# Patient Record
Sex: Male | Born: 1990 | Race: White | Hispanic: No | Smoking: Never smoker
Health system: Southern US, Community
[De-identification: ages and names within clinical notes are randomized; demographics above are authoritative.]

## PROBLEM LIST (undated history)

## (undated) DIAGNOSIS — F329 Major depressive disorder, single episode, unspecified: Secondary | ICD-10-CM

## (undated) DIAGNOSIS — F32A Depression, unspecified: Secondary | ICD-10-CM

## (undated) DIAGNOSIS — F988 Other specified behavioral and emotional disorders with onset usually occurring in childhood and adolescence: Secondary | ICD-10-CM

## (undated) DIAGNOSIS — F419 Anxiety disorder, unspecified: Secondary | ICD-10-CM

## (undated) DIAGNOSIS — E039 Hypothyroidism, unspecified: Secondary | ICD-10-CM

## (undated) HISTORY — DX: Depression, unspecified: F32.A

## (undated) HISTORY — PX: WISDOM TOOTH EXTRACTION: SHX21

## (undated) HISTORY — DX: Hypothyroidism, unspecified: E03.9

## (undated) HISTORY — PX: TYMPANOSTOMY TUBE PLACEMENT: SHX32

## (undated) HISTORY — DX: Other specified behavioral and emotional disorders with onset usually occurring in childhood and adolescence: F98.8

## (undated) HISTORY — DX: Major depressive disorder, single episode, unspecified: F32.9

## (undated) HISTORY — DX: Anxiety disorder, unspecified: F41.9

---
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
## 2011-08-28 ENCOUNTER — Ambulatory Visit (INDEPENDENT_AMBULATORY_CARE_PROVIDER_SITE_OTHER): Payer: 59 | Admitting: Internal Medicine

## 2011-08-28 VITALS — BP 110/72 | HR 77 | Temp 97.4°F | Resp 16 | Ht 70.0 in | Wt 178.0 lb

## 2011-08-28 DIAGNOSIS — F988 Other specified behavioral and emotional disorders with onset usually occurring in childhood and adolescence: Secondary | ICD-10-CM

## 2011-08-28 MED ORDER — DEXTROAMPHETAMINE SULFATE ER 10 MG PO CP24: 20.0000 mg | ORAL_CAPSULE | Freq: Every day | ORAL | Status: DC

## 2011-08-28 NOTE — Progress Notes (Signed)
  Subjective:    Patient ID: Brad Buck, male    DOB: 06/13/1990, 21 y.o.   MRN: [id number]  HPIF/U ADD On meds since Elem School/Dr Brassfield GDS-Scholarship to [employer]-econ/intern with [employer] this summer/would like to work at [employer] to [employer] for fall semester On Dex 10 SR 2 qd for many years and doing well without side effects    Review of Systems Noncontributory    Objective:   Physical Exam Vital signs normal Neurological grossly intact       Assessment & Plan:  P#1 ADD Meds ordered this encounter  Medications  . DISCONTD: dextroamphetamine (DEXTROSTAT) 10 MG tablet    Sig: Take 10 mg by mouth daily.  Marland Kitchen dextroamphetamine (DEXEDRINE) 10 MG 24 hr capsule    90 day for mail order   ( Sig: Take 2 capsules (20 mg total) by mouth daily.    Dispense:  180 capsule    Refill:  0  . dextroamphetamine (DEXEDRINE) 10 MG 24 hr capsule        30 d for now    Sig: Take 2 capsules (20 mg total) by mouth daily.    Dispense:  60 capsule    Refill:  0   Followup December 2013

## 2012-02-08 ENCOUNTER — Telehealth: Payer: Self-pay

## 2012-02-08 DIAGNOSIS — F988 Other specified behavioral and emotional disorders with onset usually occurring in childhood and adolescence: Secondary | ICD-10-CM

## 2012-02-08 NOTE — Telephone Encounter (Signed)
Pended Rx's please advise.

## 2012-02-08 NOTE — Telephone Encounter (Signed)
Dad is requesting 90 days of refills dextroamphetamine (DEXEDRINE) 10 MG 24 hr capsule He wants one to fill here and two to send off to the mail order pharmacy    CBN:  [phone]

## 2012-02-09 MED ORDER — DEXTROAMPHETAMINE SULFATE ER 10 MG PO CP24: 20.0000 mg | ORAL_CAPSULE | Freq: Every day | ORAL | Status: DC

## 2012-02-09 NOTE — Telephone Encounter (Signed)
Missed f/u in dec'13 Since back in sch at [employer] will refill but needs f/u before next ref Meds ordered this encounter  Medications  . dextroamphetamine (DEXEDRINE) 10 MG 24 hr capsule    Sig: Take 2 capsules (20 mg total) by mouth daily.    Dispense:  60 capsule    Refill:  0  . dextroamphetamine (DEXEDRINE) 10 MG 24 hr capsule    Sig: Take 2 capsules (20 mg total) by mouth daily. To fill on or after 03/09/12    Dispense:  60 capsule    Refill:  0  . dextroamphetamine (DEXEDRINE) 10 MG 24 hr capsule    Sig: Take 2 capsules (20 mg total) by mouth daily. To fill on 04/07/12 due for follow up before this runs out.    Dispense:  60 capsule    Refill:  0

## 2012-02-09 NOTE — Telephone Encounter (Signed)
Advised that rx's are ready for pickup and that needs follow up before running out

## 2012-06-26 ENCOUNTER — Ambulatory Visit (INDEPENDENT_AMBULATORY_CARE_PROVIDER_SITE_OTHER): Payer: 59 | Admitting: Internal Medicine

## 2012-06-26 VITALS — BP 120/76 | HR 88 | Temp 98.2°F | Resp 18 | Ht 70.0 in | Wt 187.0 lb

## 2012-06-26 DIAGNOSIS — R0989 Other specified symptoms and signs involving the circulatory and respiratory systems: Secondary | ICD-10-CM

## 2012-06-26 DIAGNOSIS — J157 Pneumonia due to Mycoplasma pneumoniae: Secondary | ICD-10-CM

## 2012-06-26 DIAGNOSIS — F988 Other specified behavioral and emotional disorders with onset usually occurring in childhood and adolescence: Secondary | ICD-10-CM

## 2012-06-26 DIAGNOSIS — R05 Cough: Secondary | ICD-10-CM

## 2012-06-26 MED ORDER — HYDROCODONE-HOMATROPINE 5-1.5 MG/5ML PO SYRP: 5.0000 mL | ORAL_SOLUTION | Freq: Four times a day (QID) | ORAL | Status: DC | PRN

## 2012-06-26 MED ORDER — AZITHROMYCIN 250 MG PO TABS: ORAL_TABLET | ORAL | Status: DC

## 2012-06-26 MED ORDER — DEXTROAMPHETAMINE SULFATE ER 10 MG PO CP24: 20.0000 mg | ORAL_CAPSULE | Freq: Every day | ORAL | Status: DC

## 2012-06-26 MED ORDER — PREDNISONE 20 MG PO TABS: ORAL_TABLET | ORAL | Status: DC

## 2012-06-26 NOTE — Progress Notes (Signed)
  Subjective:    Patient ID: Brad Buck, male    DOB: Mar 01, 1990, 22 y.o.   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 Good semester abroad in [place] to go to graduate school there Medications worked well for ADD To work this summer for [employer]   Review of Systems Noncontributory    Objective:   Physical Exam BP 120/76  Pulse 88  Temp(Src) 98.2 F (36.8 C) (Oral)  Resp 18  Ht 5\' 10"  (1.778 m)  Wt 187 lb (84.823 kg)  BMI 26.83 kg/m2  SpO2 98% TMs clear Nares clear Throat slight injection/no nodes Heart regular without murmur Lungs with scattered rhonchi and very slight wheeze with forced expiration Neuro intact      Assessment & Plan:  ADD (attention deficit disorder) Mycoplasma  Meds ordered this encounter  Medications  . azithromycin (ZITHROMAX) 250 MG tablet    Sig: As directed    Dispense:  6 tablet    Refill:  0  . HYDROcodone-homatropine (HYCODAN) 5-1.5 MG/5ML syrup    Sig: Take 5 mLs by mouth every 6 (six) hours as needed for cough.    Dispense:  120 mL    Refill:  0  . predniSONE (DELTASONE) 20 MG tablet    Sig: 3/2/1 single daily dose for 6 days    Dispense:  6 tablet    Refill:  0  . dextroamphetamine (DEXEDRINE) 10 MG 24 hr capsule    Sig: Take 2 capsules (20 mg total) by mouth daily. To fill on or after 08/26/12    Dispense:  60 capsule    Refill:  0  . dextroamphetamine (DEXEDRINE) 10 MG 24 hr capsule    Sig: Take 2 capsules (20 mg total) by mouth daily.    Dispense:  60 capsule    Refill:  0  . dextroamphetamine (DEXEDRINE) 10 MG 24 hr capsule    Sig: Take 2 capsules (20 mg total) by mouth daily. To fill on or after 07/26/12    Dispense:  60 capsule    Refill:  0   May call for Dexedrine for 3 more months / followup in 6 months

## 2013-01-15 ENCOUNTER — Ambulatory Visit (INDEPENDENT_AMBULATORY_CARE_PROVIDER_SITE_OTHER): Payer: 59 | Admitting: Internal Medicine

## 2013-01-15 ENCOUNTER — Encounter: Payer: Self-pay | Admitting: Internal Medicine

## 2013-01-15 VITALS — BP 114/78 | HR 88 | Temp 98.9°F | Resp 16 | Ht 68.0 in | Wt 213.2 lb

## 2013-01-15 DIAGNOSIS — F988 Other specified behavioral and emotional disorders with onset usually occurring in childhood and adolescence: Secondary | ICD-10-CM

## 2013-01-15 MED ORDER — DEXTROAMPHETAMINE SULFATE ER 10 MG PO CP24: 20.0000 mg | ORAL_CAPSULE | Freq: Every day | ORAL | Status: DC

## 2013-01-15 NOTE — Progress Notes (Signed)
   Subjective:    Patient ID: Brad Buck, male    DOB: Nov 23, 1990, 22 y.o.   MRN: [id number]  HPI followup Attention deficit disorder Meds her accomplishing purpose without side effects  Senior--[employer]--doing very well ?[employer] grad sch applied econ masters 1 year Review of Systems Noncontributory    Objective:   Physical Exam  Nursing note and vitals reviewed. Constitutional: He is oriented to person, place, and time. He appears well-developed and well-nourished. No distress.  HENT:  Head: Normocephalic and atraumatic.  Eyes: Pupils are equal, round, and reactive to light.  Neck: Normal range of motion.  Cardiovascular: Normal rate and regular rhythm.   Pulmonary/Chest: Effort normal. No respiratory distress.  Musculoskeletal: Normal range of motion.  Neurological: He is alert and oriented to person, place, and time.  Skin: Skin is warm and dry.  Psychiatric: He has a normal mood and affect. His behavior is normal.   BP 114/78  Pulse 88  Temp(Src) 98.9 F (37.2 C) (Oral)  Resp 16  Ht 5\' 8"  (1.727 m)  Wt 213 lb 3.2 oz (96.707 kg)  BMI 32.42 kg/m2  SpO2 98%        Assessment & Plan:  ADD (attention deficit disorder) - Plan: dextroamphetamine (DEXEDRINE) 10 MG 24 hr capsule to be continued May call 3 mos  Meds ordered this encounter  Medications  . dextroamphetamine (DEXEDRINE) 10 MG 24 hr capsule    Sig: Take 2 capsules (20 mg total) by mouth daily.    Dispense:  60 capsule    Refill:  0  . dextroamphetamine (DEXEDRINE) 10 MG 24 hr capsule    Sig: Take 2 capsules (20 mg total) by mouth daily.    Dispense:  180 capsule    Refill:  0

## 2013-01-25 HISTORY — PX: OTHER SURGICAL HISTORY: SHX169

## 2013-08-20 ENCOUNTER — Ambulatory Visit (INDEPENDENT_AMBULATORY_CARE_PROVIDER_SITE_OTHER): Payer: 59 | Admitting: Internal Medicine

## 2013-08-20 VITALS — BP 120/86 | HR 80 | Temp 98.8°F | Resp 16 | Ht 69.75 in | Wt 214.2 lb

## 2013-08-20 DIAGNOSIS — F988 Other specified behavioral and emotional disorders with onset usually occurring in childhood and adolescence: Secondary | ICD-10-CM

## 2013-08-20 MED ORDER — DEXTROAMPHETAMINE SULFATE ER 10 MG PO CP24: 20.0000 mg | ORAL_CAPSULE | Freq: Every day | ORAL | Status: DC

## 2013-08-20 NOTE — Progress Notes (Signed)
   Subjective:  This chart was scribed for Brad Pearsonobert P Nataliah Hatlestad, MD by Charline BillsEssence Howell, ED Scribe. The patient was seen in room 14. Patient's care was started at 5:30 PM.   Patient ID: Brad RankinsWilliam A Buck, male    DOB: 22-Oct-1990, 23 y.o.   MRN: [id number]  Chief Complaint  Patient presents with  . Medication Refill    dexedrine spansule 10 mg  . Immunizations    Wants dr to look over immunization records for school   HPI HPI Comments: Brad Buck is a 23 y.o. male, with a h/o ADD, who presents to the [facility] for medication refill of Dexedrine. Pt also wants Dr. Merla Richesoolittle to check that his immunizations are UTD for graduate school at [employer]. Pt finished undergrad at [employer] and begins grad school in August. 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   Past Medical History  Diagnosis Date  . ADD (attention deficit disorder)    Current Outpatient Prescriptions on File Prior to Visit  Medication Sig Dispense Refill  . dextroamphetamine (DEXEDRINE) 10 MG 24 hr capsule Take 2 capsules (20 mg total) by mouth daily.  60 capsule  0  . dextroamphetamine (DEXEDRINE) 10 MG 24 hr capsule Take 2 capsules (20 mg total) by mouth daily.  180 capsule  0   No current facility-administered medications on file prior to visit.   No Known Allergies  Review of Systems  Constitutional: Negative.   Allergic/Immunologic: Positive for environmental allergies.      Objective:   Physical Exam  Nursing note and vitals reviewed. Constitutional: He is oriented to person, place, and time. He appears well-developed and well-nourished.  HENT:  Head: Normocephalic and atraumatic.  Eyes: Conjunctivae and EOM are normal.  Neck: Neck supple.  Musculoskeletal: Normal range of motion.  Neurological: He is alert and oriented to person, place, and time.  Skin: Skin is  warm and dry.  Psychiatric: He has a normal mood and affect. His behavior is normal.      Assessment & Plan:   I personally performed the services described in this documentation, which was scribed in my presence. The recorded information has been reviewed and is accurate.  ADD (attention deficit disorder) - Plan: dextroamphetamine (DEXEDRINE) 10 MG 24 hr capsule, dextroamphetamine (DEXEDRINE) 10 MG 24 hr capsule  Meds ordered this encounter  Medications  . dextroamphetamine (DEXEDRINE) 10 MG 24 hr capsule    Sig: Take 2 capsules (20 mg total) by mouth daily.    Dispense:  180 capsule    Refill:  0  . dextroamphetamine (DEXEDRINE) 10 MG 24 hr capsule    Sig: Take 2 capsules (20 mg total) by mouth daily.    Dispense:  60 capsule    Refill:  0   Call or mycht 54mo f/u 6

## 2013-09-05 ENCOUNTER — Ambulatory Visit: Payer: 59 | Admitting: Internal Medicine

## 2013-09-10 ENCOUNTER — Encounter: Payer: Self-pay | Admitting: *Deleted

## 2013-11-27 ENCOUNTER — Telehealth: Payer: Self-pay

## 2013-11-27 NOTE — Telephone Encounter (Signed)
pts father called requesting dr Merla Richesdoolittle call this patient on his cell, [phone], after 1:00 today (pt has classes until then). 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

## 2013-11-27 NOTE — Telephone Encounter (Signed)
Add him to 104 12/05/13 at 345---no need to call

## 2013-12-05 ENCOUNTER — Ambulatory Visit (INDEPENDENT_AMBULATORY_CARE_PROVIDER_SITE_OTHER): Payer: 59 | Admitting: Internal Medicine

## 2013-12-05 ENCOUNTER — Encounter: Payer: Self-pay | Admitting: Internal Medicine

## 2013-12-05 VITALS — BP 127/79 | HR 76 | Temp 98.6°F | Resp 16 | Ht 70.0 in | Wt 218.4 lb

## 2013-12-05 DIAGNOSIS — F909 Attention-deficit hyperactivity disorder, unspecified type: Secondary | ICD-10-CM

## 2013-12-05 DIAGNOSIS — F988 Other specified behavioral and emotional disorders with onset usually occurring in childhood and adolescence: Secondary | ICD-10-CM

## 2013-12-05 DIAGNOSIS — F411 Generalized anxiety disorder: Secondary | ICD-10-CM

## 2013-12-05 MED ORDER — DEXTROAMPHETAMINE SULFATE ER 10 MG PO CP24: 20.0000 mg | ORAL_CAPSULE | Freq: Every day | ORAL | Status: DC

## 2013-12-05 MED ORDER — ALPRAZOLAM 0.25 MG PO TABS: 0.2500 mg | ORAL_TABLET | Freq: Two times a day (BID) | ORAL | Status: DC | PRN

## 2013-12-05 NOTE — Progress Notes (Signed)
Subjective:    Patient ID: Brad RankinsWilliam A Buck, male    DOB: [birth year], 23 y.o.   MRN: [id number]  HPI   23 year old male with PMH of ADD is here today for a medication follow up and a concern of anxiety.  This began about a month after school began.  He is currently in his first year of graduate school, 3 months ago.  After a month of school, he has noticed that he has these "anxiety-attacks".  They are episodic and during test-taking where he has an onset of sweaty palms, tremulousness, and he feels as if he cannot access the knowledge that he knows.  He is averaging Cs, high Ds.  The symptoms resolve but leave a residual anxious and depressive feeling that he is unsuccessful and will fail out.  He is able to cope himself through but it takes 1-3 days for complete resolution.  He is afraid that he is going to get left behind.   August 1st, he began school.  He states that he has these anxiety-like attacks, which he is able to get himself back together, but it takes him 1-3 days to get back to his baseline.  The behavior is effecting his ability to complete projects, studying, and assignments.  The episodes generally occur with a testing.  He has an onset of sweaty palms, slight tremulousness, and he feels as if he not able to access the knowledge that he knows.  It does not effect his sleeping pattern.  He states that he was having counseling with a psychologist months ago , but not since he started school.    Weight loss: Exercise has been less due to shin splints.  He runs on hard pavements.  Uses compression sleeves.      Past Medical History  Diagnosis Date  . ADD (attention deficit disorder)      Review of Systems     Objective:   Physical Exam  Constitutional: He is oriented to person, place, and time. He appears well-developed and well-nourished. No distress.  Eyes: Pupils are equal, round, and reactive to light.  Neck: Normal range of motion.  Cardiovascular: Normal rate.     Pulmonary/Chest: Effort normal and breath sounds normal. No respiratory distress.  Musculoskeletal: He exhibits no edema.  Neurological: He is alert and oriented to person, place, and time.  Skin: Skin is warm and dry.  Psychiatric: He has a normal mood and affect. His behavior is normal. Judgment and thought content normal.  Nursing note and vitals reviewed.  BP 127/79 mmHg  Pulse 76  Temp(Src) 98.6 F (37 C) (Oral)  Resp 16  Ht 5\' 10"  (1.778 m)  Wt 218 lb 6.4 oz (99.066 kg)  BMI 31.34 kg/m2  SpO2 98%       Assessment & Plan:  23 year old male with PMH of ADD is here today for medication recheck.   He reports symptoms that are consistent with a new onset of anxiety, specifically with testing.  ADD -Stable will continue same dosage of the dexedrine. Meds ordered this encounter  Medications   . dextroamphetamine (DEXEDRINE) 10 MG 24 hr capsule    Sig: Take 2 capsules (20 mg total) by mouth daily.    Dispense:  180 capsule    Refill:  0  . dextroamphetamine (DEXEDRINE) 10 MG 24 hr capsule    Sig: Take 2 capsules (20 mg total) by mouth daily.    Dispense:  60 capsule    Refill:  0   Anxiety State -New onset of symptoms that are episodic///1-2x per week Meds ordered this encounter  Medications   . ALPRAZolam (XANAX) 0.25 MG tablet    Sig: Take 1 tablet (0.25 mg total) by mouth 2 (two) times daily as needed for anxiety.    Dispense:  60 tablet    Refill:  0    -F/u in 6 months  -Patient will communicate efficacy of the alprazolam via MyCharts   I have participated in the care of this patient with the Advanced Practice Provider and agree with Diagnosis and Plan as documented. Robert P. Merla Richesoolittle, M.D.

## 2014-04-24 ENCOUNTER — Other Ambulatory Visit: Payer: Self-pay | Admitting: Internal Medicine

## 2014-04-24 DIAGNOSIS — F988 Other specified behavioral and emotional disorders with onset usually occurring in childhood and adolescence: Secondary | ICD-10-CM

## 2014-04-24 MED ORDER — ALPRAZOLAM 0.25 MG PO TABS: 0.2500 mg | ORAL_TABLET | Freq: Two times a day (BID) | ORAL | Status: DC | PRN

## 2014-04-24 MED ORDER — DEXTROAMPHETAMINE SULFATE ER 10 MG PO CP24: 20.0000 mg | ORAL_CAPSULE | Freq: Every day | ORAL | Status: DC

## 2014-04-24 NOTE — Progress Notes (Unsigned)
Grad sch at [employer]--resp to meds--needs ref--?grad august Meds ordered this encounter  Medications  . dextroamphetamine (DEXEDRINE) 10 MG 24 hr capsule    Sig: Take 2 capsules (20 mg total) by mouth daily.    Dispense:  60 capsule    Refill:  0  . dextroamphetamine (DEXEDRINE) 10 MG 24 hr capsule    Sig: Take 2 capsules (20 mg total) by mouth daily.    Dispense:  180 capsule    Refill:  0  . ALPRAZolam (XANAX) 0.25 MG tablet    Sig: Take 1 tablet (0.25 mg total) by mouth 2 (two) times daily as needed for anxiety.    Dispense:  60 tablet    Refill:  0

## 2014-05-03 ENCOUNTER — Encounter: Payer: Self-pay | Admitting: Podiatry

## 2014-05-03 ENCOUNTER — Ambulatory Visit (INDEPENDENT_AMBULATORY_CARE_PROVIDER_SITE_OTHER): Payer: 59 | Admitting: Podiatry

## 2014-05-03 VITALS — BP 133/87 | HR 96 | Resp 10 | Ht 70.0 in | Wt 210.0 lb

## 2014-05-03 DIAGNOSIS — L600 Ingrowing nail: Secondary | ICD-10-CM | POA: Diagnosis not present

## 2014-05-03 MED ORDER — CEPHALEXIN 500 MG PO CAPS: 500.0000 mg | ORAL_CAPSULE | Freq: Three times a day (TID) | ORAL | Status: DC

## 2014-05-03 NOTE — Progress Notes (Signed)
   Subjective:    Patient ID: Brad Buck, male    DOB: 11-Dec-1990, 24 y.o.   MRN: [id number]  HPI Comments: 24 year old male presents the office they with complaints of painful ingrown toenail along the fourth toe inside aspect. He also states he has noticed some increased redness and small amount of clear drainage coming from the toenail border. He states that he previously had a procedure performed in this toe proximal and one year ago at another facility. No other complaints at this time. Denies any history of injury or trauma.     Review of Systems  All other systems reviewed and are negative.      Objective:   Physical Exam AAO 3, NAD DP/PT pulses palpable, CRT less than 3 seconds  Protective sensation intact with Simms weinstein monofilament, vibratory sensation intact, Achilles tendon reflex intact. On the medial aspect the right fourth toenail is evidence of incurvation of the nail border and there is tenderness to palpation overlying this area. There is erythema to the medial border of the nail and a small amount of clear drainage. There is no ascending cellulitis. No areas of fluctuance or crepitus. No malodor. Remaining nails without pathology. No other areas of tenderness to bilateral lower extremes. No other areas of edema, erythema, increase in warmth. MMT 5/5, ROM WNL No open lesions or pre-ulcerative lesions identified bilaterally. No pain with calf compression, swelling, warmth, erythema.      Assessment & Plan:  24 year old male with right fourth medial ingrown toenail, localized infection -Treatment options were discussed both conservative and surgical including alternatives, risks, complications. -At this time, recommended partial nail removal without chemical matricectomy to the right medial 4th toe due to infection. Risks and complications were discussed with the patient for which they understand and  verbally consent to the procedure. Under sterile conditions a  total of 3 mL of a mixture of 2% lidocaine plain and 0.5% Marcaine plain was infiltrated in a digial block fashion. Once anesthetized, the skin was prepped in sterile fashion. A tourniquet was then applied. Next the medial border of the right 4th toenail border was sharply excised making sure to remove the entire offending nail border. A small amount of purulence was expressed. Once the nail was removed, the area was debrided and the underlying skin was intact. The area was irrigated and hemostasis was obtained.  A dry sterile dressing was applied. After application of the dressing the tourniquet was removed and there is found to be an immediate capillary refill time to the digit. The patient tolerated the procedure well any complications. Post procedure instructions were discussed the patient for which he verbally understood. Follow-up in one week for nail check or sooner if any problems are to arise. Discussed signs/symptoms of worsening infection and directed to call the office immediately should any occur or go directly to the emergency room. In the meantime, encouraged to call the office with any questions, concerns, changes symptoms. Prescribed keflex.

## 2014-05-03 NOTE — Patient Instructions (Addendum)
Soak Instructions    THE DAY AFTER THE PROCEDURE  Place 1/4 cup of epsom salts in a quart of warm tap water.  Submerge your foot or feet with outer bandage intact for the initial soak; this will allow the bandage to become moist and wet for easy lift off.  Once you remove your bandage, continue to soak in the solution for 20 minutes.  This soak should be done twice a day.  Next, remove your foot or feet from solution, blot dry the affected area and cover.  You may use a band aid large enough to cover the area or use gauze and tape.  Apply other medications to the area as directed by the doctor such as polysporin neosporin.  IF YOUR SKIN BECOMES IRRITATED WHILE USING THESE INSTRUCTIONS, IT IS OKAY TO SWITCH TO  WHITE VINEGAR AND WATER. Or you may use antibacterial soap and water to keep the toe clean  Monitor for any signs/symptoms of infection. Call the office immediately if any occur or go directly to the emergency room. Call with any questions/concerns.  For over the counter toenail fungus treatment you can purchase fungi-nail.

## 2014-08-01 ENCOUNTER — Ambulatory Visit (INDEPENDENT_AMBULATORY_CARE_PROVIDER_SITE_OTHER): Payer: 59 | Admitting: Family Medicine

## 2014-08-01 VITALS — BP 118/76 | HR 79 | Temp 98.4°F | Resp 17 | Ht 70.5 in | Wt 234.8 lb

## 2014-08-01 DIAGNOSIS — R0609 Other forms of dyspnea: Secondary | ICD-10-CM | POA: Diagnosis not present

## 2014-08-01 DIAGNOSIS — S39012A Strain of muscle, fascia and tendon of lower back, initial encounter: Secondary | ICD-10-CM | POA: Diagnosis not present

## 2014-08-01 DIAGNOSIS — G4700 Insomnia, unspecified: Secondary | ICD-10-CM

## 2014-08-01 DIAGNOSIS — R6881 Early satiety: Secondary | ICD-10-CM | POA: Diagnosis not present

## 2014-08-01 DIAGNOSIS — G479 Sleep disorder, unspecified: Secondary | ICD-10-CM

## 2014-08-01 DIAGNOSIS — R140 Abdominal distension (gaseous): Secondary | ICD-10-CM | POA: Diagnosis not present

## 2014-08-01 DIAGNOSIS — E039 Hypothyroidism, unspecified: Secondary | ICD-10-CM

## 2014-08-01 DIAGNOSIS — R197 Diarrhea, unspecified: Secondary | ICD-10-CM

## 2014-08-01 LAB — POCT URINALYSIS DIPSTICK
Bilirubin, UA: NEGATIVE
Blood, UA: NEGATIVE
Glucose, UA: NEGATIVE
Ketones, UA: NEGATIVE
Leukocytes, UA: NEGATIVE
Nitrite, UA: NEGATIVE
Protein, UA: NEGATIVE
Spec Grav, UA: 1.02
Urobilinogen, UA: 0.2
pH, UA: 6.5

## 2014-08-01 LAB — POCT GLYCOSYLATED HEMOGLOBIN (HGB A1C): Hemoglobin A1C: 5.5

## 2014-08-01 LAB — POCT SEDIMENTATION RATE: POCT SED RATE: 10 mm/hr (ref 0–22)

## 2014-08-01 MED ORDER — CYCLOBENZAPRINE HCL 10 MG PO TABS: 10.0000 mg | ORAL_TABLET | Freq: Every day | ORAL | Status: DC

## 2014-08-01 MED ORDER — MELOXICAM 15 MG PO TABS
15.0000 mg | ORAL_TABLET | Freq: Every day | ORAL | Status: DC
Start: 2014-08-01 — End: 2014-09-18

## 2014-08-01 NOTE — Patient Instructions (Addendum)
Start a probiotic.  A lot of times this is caused by an imbalance of the normal bacteria in your system which has sometimes been caused by exposures, change in diet, yeast, change in acid or others.  You can try some simethicone four times a day.  If you symptoms come back, I would be happy to refer you to GI but as we talked about, we can also order a breath test for abnormal type of stomach bacteria (H. Pylori) as well as stool tests. Try an elimination diet - first thing to remove is lactose - if that doesn't help, then add it back and can try gluten elimination.  See below for a grouping of the different types of sugars of which people tend to be most intolerant.  Food Choices to Help Relieve Diarrhea When you have diarrhea, the foods you eat and your eating habits are very important. Choosing the right foods and drinks can help relieve diarrhea. Also, because diarrhea can last up to 7 days, you need to replace lost fluids and electrolytes (such as sodium, potassium, and chloride) in order to help prevent dehydration.  WHAT GENERAL GUIDELINES DO I NEED TO FOLLOW?  Slowly drink 1 cup (8 oz) of fluid for each episode of diarrhea. If you are getting enough fluid, your urine will be clear or pale yellow.  Eat starchy foods. Some good choices include white rice, white toast, pasta, low-fiber cereal, baked potatoes (without the skin), saltine crackers, and bagels.  Avoid large servings of any cooked vegetables.  Limit fruit to two servings per day. A serving is  cup or 1 small piece.  Choose foods with less than 2 g of fiber per serving.  Limit fats to less than 8 tsp (38 g) per day.  Avoid fried foods.  Eat foods that have probiotics in them. Probiotics can be found in certain dairy products.  Avoid foods and beverages that may increase the speed at which food moves through the stomach and intestines (gastrointestinal tract). Things to avoid include:  High-fiber foods, such as dried fruit,  raw fruits and vegetables, nuts, seeds, and whole grain foods.  Spicy foods and high-fat foods.  Foods and beverages sweetened with high-fructose corn syrup, honey, or sugar alcohols such as xylitol, sorbitol, and mannitol. WHAT FOODS ARE RECOMMENDED? Grains White rice. White, [place], or pita breads (fresh or toasted), including plain rolls, buns, or bagels. White pasta. Saltine, soda, or graham crackers. Pretzels. Low-fiber cereal. Cooked cereals made with water (such as cornmeal, farina, or cream cereals). Plain muffins. Matzo. Melba toast. Zwieback.  Vegetables Potatoes (without the skin). Strained tomato and vegetable juices. Most well-cooked and canned vegetables without seeds. Tender lettuce. Fruits Cooked or canned applesauce, apricots, cherries, fruit cocktail, grapefruit, peaches, pears, or plums. Fresh bananas, apples without skin, cherries, grapes, cantaloupe, grapefruit, peaches, oranges, or plums.  Meat and Other Protein Products Baked or boiled chicken. Eggs. Tofu. Fish. Seafood. Smooth peanut butter. Ground or well-cooked tender beef, ham, veal, lamb, pork, or poultry.  Dairy Plain yogurt, kefir, and unsweetened liquid yogurt. Lactose-free milk, buttermilk, or soy milk. Plain hard cheese. Beverages Sport drinks. Clear broths. Diluted fruit juices (except prune). Regular, caffeine-free sodas such as ginger ale. Water. Decaffeinated teas. Oral rehydration solutions. Sugar-free beverages not sweetened with sugar alcohols. Other Bouillon, broth, or soups made from recommended foods.  The items listed above may not be a complete list of recommended foods or beverages. Contact your dietitian for more options. WHAT FOODS ARE NOT RECOMMENDED? Grains  Whole grain, whole wheat, bran, or rye breads, rolls, pastas, crackers, and cereals. Wild or brown rice. Cereals that contain more than 2 g of fiber per serving. Corn tortillas or taco shells. Cooked or dry oatmeal. Granola.  Popcorn. Vegetables Raw vegetables. Cabbage, broccoli, Brussels sprouts, artichokes, baked beans, beet greens, corn, kale, legumes, peas, sweet potatoes, and yams. Potato skins. Cooked spinach and cabbage. Fruits Dried fruit, including raisins and dates. Raw fruits. Stewed or dried prunes. Fresh apples with skin, apricots, mangoes, pears, raspberries, and strawberries.  Meat and Other Protein Products Chunky peanut butter. Nuts and seeds. Beans and lentils. Tomasa BlaseBacon.  Dairy High-fat cheeses. Milk, chocolate milk, and beverages made with milk, such as milk shakes. Cream. Ice cream. Sweets and Desserts Sweet rolls, doughnuts, and sweet breads. Pancakes and waffles. Fats and Oils Butter. Cream sauces. Margarine. Salad oils. Plain salad dressings. Olives. Avocados.  Beverages Caffeinated beverages (such as coffee, tea, soda, or energy drinks). Alcoholic beverages. Fruit juices with pulp. Prune juice. Soft drinks sweetened with high-fructose corn syrup or sugar alcohols. Other Coconut. Hot sauce. Chili powder. Mayonnaise. Gravy. Cream-based or milk-based soups.  The items listed above may not be a complete list of foods and beverages to avoid. Contact your dietitian for more information. WHAT SHOULD I DO IF I BECOME DEHYDRATED? Diarrhea can sometimes lead to dehydration. Signs of dehydration include dark urine and dry mouth and skin. If you think you are dehydrated, you should rehydrate with an oral rehydration solution. These solutions can be purchased at pharmacies, retail stores, or online.  Drink -1 cup (120-240 mL) of oral rehydration solution each time you have an episode of diarrhea. If drinking this amount makes your diarrhea worse, try drinking smaller amounts more often. For example, drink 1-3 tsp (5-15 mL) every 5-10 minutes.  A general rule for staying hydrated is to drink 1-2 L of fluid per day. Talk to your health care provider about the specific amount you should be drinking each day.  Drink enough fluids to keep your urine clear or pale yellow. Document Released: 04/03/2003 Document Revised: 01/16/2013 Document Reviewed: 12/04/2012 [facility] Patient Information 2015 [place], [place]. This information is not intended to replace advice given to you by your health care provider. Make sure you discuss any questions you have with your health care provider.  Diet and Irritable Bowel Syndrome  No cure has been found for irritable bowel syndrome (IBS). Many options are available to treat the symptoms. Your caregiver will give you the best treatments available for your symptoms. He or she will also encourage you to manage stress and to make changes to your diet. You need to work with your caregiver and Registered Dietician to find the best combination of medicine, diet, counseling, and support to control your symptoms. The following are some diet suggestions. FOODS THAT MAKE IBS WORSE  Fatty foods, such as JamaicaFrench fries.  Milk products, such as cheese or ice cream.  Chocolate.  Alcohol.  Caffeine (found in coffee and some sodas).  Carbonated drinks, such as soda. If certain foods cause symptoms, you should eat less of them or stop eating them. FOOD JOURNAL   Keep a journal of the foods that seem to cause distress. Write down:  What you are eating during the day and when.  What problems you are having after eating.  When the symptoms occur in relation to your meals.  What foods always make you feel badly.  Take your notes with you to your caregiver to see if you  should stop eating certain foods. FOODS THAT MAKE IBS BETTER Fiber reduces IBS symptoms, especially constipation, because it makes stools soft, bulky, and easier to pass. Fiber is found in bran, bread, cereal, beans, fruit, and vegetables. Examples of foods with fiber include:  Apples.  Peaches.  Pears.  Berries.  Figs.  Broccoli, raw.  Cabbage.  Carrots.  Raw peas.  Kidney beans.  [place]  beans.  Whole-grain bread.  Whole-grain cereal. Add foods with fiber to your diet a little at a time. This will let your body get used to them. Too much fiber at once might cause gas and swelling of your abdomen. This can trigger symptoms in a person with IBS. Caregivers usually recommend a diet with enough fiber to produce soft, painless bowel movements. High fiber diets may cause gas and bloating. However, these symptoms often go away within a few weeks, as your body adjusts. In many cases, dietary fiber may lessen IBS symptoms, particularly constipation. However, it may not help pain or diarrhea. High fiber diets keep the colon mildly enlarged (distended) with the added fiber. This may help prevent spasms in the colon. Some forms of fiber also keep water in the stool, thereby preventing hard stools that are difficult to pass.  Besides telling you to eat more foods with fiber, your caregiver may also tell you to get more fiber by taking a fiber pill or drinking water mixed with a special high fiber powder. An example of this is a natural fiber laxative containing psyllium seed.  TIPS  Large meals can cause cramping and diarrhea in people with IBS. If this happens to you, try eating 4 or 5 small meals a day, or try eating less at each of your usual 3 meals. It may also help if your meals are low in fat and high in carbohydrates. Examples of carbohydrates are pasta, rice, whole-grain breads and cereals, fruits, and vegetables.  If dairy products cause your symptoms to flare up, you can try eating less of those foods. You might be able to handle yogurt better than other dairy products, because it contains bacteria that helps with digestion. Dairy products are an important source of calcium and other nutrients. If you need to avoid dairy products, be sure to talk with a Registered Dietitian about getting these nutrients through other food sources.  Drink enough water and fluids to keep your urine clear  or pale yellow. This is important, especially if you have diarrhea. FOR MORE INFORMATION  International Foundation for Functional Gastrointestinal Disorders: www.iffgd.org  National Digestive Diseases Information Clearinghouse: digestive.StageSync.si Document Released: 04/03/2003 Document Revised: 04/05/2011 Document Reviewed: 04/13/2013 [facility] Patient Information 2015 [place], [place]. This information is not intended to replace advice given to you by your health care provider. Make sure you discuss any questions you have with your health care provider.

## 2014-08-01 NOTE — Progress Notes (Signed)
Subjective:  This chart was scribed for Delman Cheadle, MD by Moises Blood, Medical Scribe. This patient was seen in Room 13 and the patient's care was started 5:10 PM.    Patient ID: Brad Buck, male    DOB: [birth year], 24 y.o.   MRN: [id number] Chief Complaint  Patient presents with  . Insomnia    started 2 weeks ago, getting worse   . Diarrhea    started 1 weeks ago. going more often   . Back Pain    pa thinks is coming from work   . Heat Exposure  . Fatigue    feeling tired all the time     HPI Brad Buck is a 24 y.o. male who presents to [facility] complaining of frequent and intermittent diarrhea for a week now. He notes the frequency up to 6-7 times a day. He is feeling distended, has decreased appetite, and more gas passing. He feels that he gets full quicker after eating. He also denies significant changes to food. He denies any heart burn, nausea, and vomiting. He denies trying anything to relieve. He denies anyone else near with similar problems. He denies getting up at night to go to the bathroom. He denies history of similar symptoms.   Pt states that his insomnia started 2 weeks ago. He used to wake up and go back to sleep within 15 minutes. But for the past 2-3 days, he would wake up and cannot go back to sleep until 45 minutes to an hour. He denies any significant changes in activity. He has been avoiding caffeine intake. He was previously working out more until the diarrhea kicked in with fatigue, and SOB. He denies trying any medication.   Pt states that his back pain is a possible strain for 10 days now. He's been treating the area with ice and heat, massaging and resting but the pain still persists. He has tried taking ibuprofen only when it gets really bad where he cannot walk up straight. He denies breathing problems laying supine. He notes rare use of alcohol. He denies history of asthma. He has Fhx of asthma.   He denies h/o abd surgeries. He's been gaining weight  due to stress from graduate school. He states he has not been eating as healthy. He's studying in applied [employer]. He denies any significant changes in hair or skin, and any changes in temperature or sensitivity to heat or cold.   Has been on Dextroamphetamine since young, no change in dosage.    Past Medical History  Diagnosis Date  . ADD (attention deficit disorder)    Current Outpatient Prescriptions on File Prior to Visit  Medication Sig Dispense Refill  . ALPRAZolam (XANAX) 0.25 MG tablet Take 1 tablet (0.25 mg total) by mouth 2 (two) times daily as needed for anxiety. 60 tablet 0  . dextroamphetamine (DEXEDRINE) 10 MG 24 hr capsule Take 2 capsules (20 mg total) by mouth daily. 60 capsule 0  . dextroamphetamine (DEXEDRINE) 10 MG 24 hr capsule Take 2 capsules (20 mg total) by mouth daily. 180 capsule 0  . cephALEXin (KEFLEX) 500 MG capsule Take 1 capsule (500 mg total) by mouth 3 (three) times daily. (Patient not taking: Reported on 08/01/2014) 30 capsule 2   No current facility-administered medications on file prior to visit.   No Known Allergies     Review of Systems  Constitutional: Positive for appetite change and fatigue. Negative for fever, chills and diaphoresis.  HENT: Negative  for congestion.   Respiratory: Positive for shortness of breath.   Gastrointestinal: Positive for diarrhea and abdominal distention. Negative for nausea, vomiting and constipation.  Musculoskeletal: Positive for back pain.  Psychiatric/Behavioral: Positive for sleep disturbance.       Objective:   Physical Exam  Constitutional: He is oriented to person, place, and time. He appears well-developed and well-nourished. No distress.  HENT:  Head: Normocephalic and atraumatic.  Eyes: EOM are normal. Pupils are equal, round, and reactive to light.  Neck: Neck supple. No thyromegaly present.  Cardiovascular: Normal rate, regular rhythm, S1 normal, S2 normal and normal heart sounds.   No  murmur heard. Pulmonary/Chest: Effort normal and breath sounds normal. No respiratory distress. He exhibits no tenderness.  Abdominal: Soft. He exhibits no distension. Bowel sounds are increased. There is negative Murphy's sign.  Musculoskeletal: Normal range of motion.  Lymphadenopathy:    He has no cervical adenopathy.  Neurological: He is alert and oriented to person, place, and time.  Skin: Skin is warm and dry.  Psychiatric: He has a normal mood and affect. His behavior is normal.  Nursing note and vitals reviewed. BP 118/76 mmHg  Pulse 79  Temp(Src) 98.4 F (36.9 C) (Oral)  Resp 17  Ht 5' 10.5" (1.791 m)  Wt 234 lb 12.8 oz (106.505 kg)  BMI 33.20 kg/m2  SpO2 97%  PF 600 L/min   [facility] reading (PRIMARY) by  Dr. Brigitte Pulse. EKG: right axis deviation w/ flipped P waves in aVR and J point notch lead II and Q waves in late III - sinus rhythym - no prior for comparison     Assessment & Plan:   1. Diarrhea   2. Early satiety   3. Abdominal bloating   4. Dyspnea on exertion   5. Insomnia   6. Sleep disorder   7. Low back strain, initial encounter    8.  Hypothyroidism - new diagnosis but likely cause of all above sxs - start levothyrxine 125 mcg daily on empty stomach.  Cr mildly elev at 1.45 and ast mildly elev at 45 so avoid alcohol, otc pain meds, push fluids. Recheck labs in 6-8 wks.  Orders Placed This Encounter  Procedures  . Comprehensive metabolic panel  . Thyroid Panel With TSH  . CBC with Differential/Platelet  . POCT urinalysis dipstick  . POCT SEDIMENTATION RATE  . POCT glycosylated hemoglobin (Hb A1C)  . EKG 12-Lead    Meds ordered this encounter  Medications  . cyclobenzaprine (FLEXERIL) 10 MG tablet    Sig: Take 1 tablet (10 mg total) by mouth at bedtime.    Dispense:  30 tablet    Refill:  0  . meloxicam (MOBIC) 15 MG tablet    Sig: Take 1 tablet (15 mg total) by mouth daily.    Dispense:  30 tablet    Refill:  0    I personally performed the services  described in this documentation, which was scribed in my presence. The recorded information has been reviewed and considered, and addended by me as needed.  Delman Cheadle, MD MPH  Results for orders placed or performed in visit on 08/01/14  Comprehensive metabolic panel  Result Value Ref Range   Sodium 137 135 - 145 mEq/L   Potassium 4.8 3.5 - 5.3 mEq/L   Chloride 99 96 - 112 mEq/L   CO2 28 19 - 32 mEq/L   Glucose, Bld 74 70 - 99 mg/dL   BUN 18 6 - 23 mg/dL   Creat  1.45 (H) 0.50 - 1.35 mg/dL   Total Bilirubin 0.5 0.2 - 1.2 mg/dL   Alkaline Phosphatase 66 39 - 117 U/L   AST 58 (H) 0 - 37 U/L   ALT 52 0 - 53 U/L   Total Protein 8.0 6.0 - 8.3 g/dL   Albumin 4.8 3.5 - 5.2 g/dL   Calcium 10.0 8.4 - 10.5 mg/dL  Thyroid Panel With TSH  Result Value Ref Range   T4, Total <0.7 (L) 4.5 - 12.0 ug/dL   T3 Uptake 22 22 - 35 %   Free Thyroxine Index NOT CALC 1.4 - 3.8   TSH 44.094 (H) 0.350 - 4.500 uIU/mL  CBC with Differential/Platelet  Result Value Ref Range   WBC 6.3 4.0 - 10.5 K/uL   RBC 5.18 4.22 - 5.81 MIL/uL   Hemoglobin 14.6 13.0 - 17.0 g/dL   HCT 43.0 39.0 - 52.0 %   MCV 83.0 78.0 - 100.0 fL   MCH 28.2 26.0 - 34.0 pg   MCHC 34.0 30.0 - 36.0 g/dL   RDW 15.3 11.5 - 15.5 %   Platelets 294 150 - 400 K/uL   MPV 9.9 8.6 - 12.4 fL   Neutrophils Relative % 43 43 - 77 %   Neutro Abs 2.7 1.7 - 7.7 K/uL   Lymphocytes Relative 40 12 - 46 %   Lymphs Abs 2.5 0.7 - 4.0 K/uL   Monocytes Relative 11 3 - 12 %   Monocytes Absolute 0.7 0.1 - 1.0 K/uL   Eosinophils Relative 4 0 - 5 %   Eosinophils Absolute 0.3 0.0 - 0.7 K/uL   Basophils Relative 2 (H) 0 - 1 %   Basophils Absolute 0.1 0.0 - 0.1 K/uL   Smear Review Criteria for review not met   POCT urinalysis dipstick  Result Value Ref Range   Color, UA yellow    Clarity, UA clear    Glucose, UA neg    Bilirubin, UA neg    Ketones, UA neg    Spec Grav, UA 1.020    Blood, UA neg    pH, UA 6.5    Protein, UA neg    Urobilinogen, UA 0.2     Nitrite, UA neg    Leukocytes, UA Negative Negative  POCT SEDIMENTATION RATE  Result Value Ref Range   POCT SED RATE 10 0 - 22 mm/hr  POCT glycosylated hemoglobin (Hb A1C)  Result Value Ref Range   Hemoglobin A1C 5.5

## 2014-08-02 LAB — COMPREHENSIVE METABOLIC PANEL
ALT: 52 U/L (ref 0–53)
AST: 58 U/L — ABNORMAL HIGH (ref 0–37)
Albumin: 4.8 g/dL (ref 3.5–5.2)
Alkaline Phosphatase: 66 U/L (ref 39–117)
BUN: 18 mg/dL (ref 6–23)
CO2: 28 mEq/L (ref 19–32)
Calcium: 10 mg/dL (ref 8.4–10.5)
Chloride: 99 mEq/L (ref 96–112)
Creat: 1.45 mg/dL — ABNORMAL HIGH (ref 0.50–1.35)
Glucose, Bld: 74 mg/dL (ref 70–99)
Potassium: 4.8 mEq/L (ref 3.5–5.3)
Sodium: 137 mEq/L (ref 135–145)
Total Bilirubin: 0.5 mg/dL (ref 0.2–1.2)
Total Protein: 8 g/dL (ref 6.0–8.3)

## 2014-08-02 LAB — CBC WITH DIFFERENTIAL/PLATELET
Basophils Absolute: 0.1 10*3/uL (ref 0.0–0.1)
Basophils Relative: 2 % — ABNORMAL HIGH (ref 0–1)
Eosinophils Absolute: 0.3 10*3/uL (ref 0.0–0.7)
Eosinophils Relative: 4 % (ref 0–5)
HCT: 43 % (ref 39.0–52.0)
Hemoglobin: 14.6 g/dL (ref 13.0–17.0)
Lymphocytes Relative: 40 % (ref 12–46)
Lymphs Abs: 2.5 10*3/uL (ref 0.7–4.0)
MCH: 28.2 pg (ref 26.0–34.0)
MCHC: 34 g/dL (ref 30.0–36.0)
MCV: 83 fL (ref 78.0–100.0)
MPV: 9.9 fL (ref 8.6–12.4)
Monocytes Absolute: 0.7 10*3/uL (ref 0.1–1.0)
Monocytes Relative: 11 % (ref 3–12)
Neutro Abs: 2.7 10*3/uL (ref 1.7–7.7)
Neutrophils Relative %: 43 % (ref 43–77)
Platelets: 294 10*3/uL (ref 150–400)
RBC: 5.18 MIL/uL (ref 4.22–5.81)
RDW: 15.3 % (ref 11.5–15.5)
WBC: 6.3 10*3/uL (ref 4.0–10.5)

## 2014-08-02 LAB — THYROID PANEL WITH TSH
T3 Uptake: 22 % (ref 22–35)
T4, Total: <0.7 ug/dL — ABNORMAL LOW (ref 4.5–12.0)
TSH: 44.094 u[IU]/mL — ABNORMAL HIGH (ref 0.350–4.500)

## 2014-08-07 ENCOUNTER — Encounter: Payer: Self-pay | Admitting: Family Medicine

## 2014-08-07 DIAGNOSIS — E039 Hypothyroidism, unspecified: Secondary | ICD-10-CM | POA: Insufficient documentation

## 2014-08-07 MED ORDER — LEVOTHYROXINE SODIUM 125 MCG PO TABS: 125.0000 ug | ORAL_TABLET | Freq: Every day | ORAL | Status: DC

## 2014-09-18 ENCOUNTER — Encounter: Payer: Self-pay | Admitting: Internal Medicine

## 2014-09-18 ENCOUNTER — Ambulatory Visit (INDEPENDENT_AMBULATORY_CARE_PROVIDER_SITE_OTHER): Payer: 59 | Admitting: Internal Medicine

## 2014-09-18 VITALS — BP 104/80 | HR 69 | Temp 98.4°F | Resp 16 | Ht 70.0 in | Wt 227.8 lb

## 2014-09-18 DIAGNOSIS — F909 Attention-deficit hyperactivity disorder, unspecified type: Secondary | ICD-10-CM

## 2014-09-18 DIAGNOSIS — F988 Other specified behavioral and emotional disorders with onset usually occurring in childhood and adolescence: Secondary | ICD-10-CM

## 2014-09-18 MED ORDER — DEXTROAMPHETAMINE SULFATE ER 10 MG PO CP24: 20.0000 mg | ORAL_CAPSULE | Freq: Every day | ORAL | Status: DC

## 2014-09-18 NOTE — Progress Notes (Signed)
F/u Chief Complaint  Patient presents with  . Follow-up  . lethargic    "Better"  . Fatigue    "Better"  . Medication Refill  . Anxiety    "Better"   he is improved following the discovery of hypothyroidism and starting on medications  He has worked the summer at the [employer] center and may be employed there for many finishes his last semester graduate school in January. He has done well with less anxiety now that school is mostly completed.    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 dextroamphetamine (DEXEDRINE) 10 MG 24 hr capsule    Sig: Take 2 capsules (20 mg total) by mouth daily.    Dispense:  60 capsule    Refill:  0  . dextroamphetamine (DEXEDRINE) 10 MG 24 hr capsule    Sig: Take 2 capsules (20 mg total) by mouth daily.    Dispense:  180 capsule    Refill:  0  79mo supply  call3 fu6

## 2014-10-09 ENCOUNTER — Telehealth: Payer: Self-pay

## 2014-10-09 MED ORDER — LEVOTHYROXINE SODIUM 125 MCG PO TABS: 125.0000 ug | ORAL_TABLET | Freq: Every day | ORAL | Status: DC

## 2014-10-09 NOTE — Telephone Encounter (Signed)
Pharm called to get new Rx (transferred) for levothyroxine. Per Dr Alver Fisher notes on pt's lab results, pt was to have come back by now to re-check TSH. I gave VO for 1 mos RF w/inst's that pt needs to RTC for labs for more.

## 2014-10-10 ENCOUNTER — Other Ambulatory Visit: Payer: Self-pay | Admitting: *Deleted

## 2014-10-10 ENCOUNTER — Encounter: Payer: Self-pay | Admitting: Family Medicine

## 2014-10-10 ENCOUNTER — Ambulatory Visit (INDEPENDENT_AMBULATORY_CARE_PROVIDER_SITE_OTHER): Payer: 59 | Admitting: Family Medicine

## 2014-10-10 VITALS — BP 105/72 | HR 82 | Temp 99.1°F | Resp 16 | Ht 70.0 in | Wt 227.4 lb

## 2014-10-10 DIAGNOSIS — E039 Hypothyroidism, unspecified: Secondary | ICD-10-CM

## 2014-10-10 NOTE — Patient Instructions (Signed)
Hypothyroidism The thyroid is a large gland located in the lower front of your neck. The thyroid gland helps control metabolism. Metabolism is how your body handles food. It controls metabolism with the hormone thyroxine. When this gland is underactive (hypothyroid), it produces too little hormone.  CAUSES These include:   Absence or destruction of thyroid tissue.  Goiter due to iodine deficiency.  Goiter due to medications.  Congenital defects (since birth).  Problems with the pituitary. This causes a lack of TSH (thyroid stimulating hormone). This hormone tells the thyroid to turn out more hormone. SYMPTOMS  Lethargy (feeling as though you have no energy)  Cold intolerance  Weight gain (in spite of normal food intake)  Dry skin  Coarse hair  Menstrual irregularity (if severe, may lead to infertility)  Slowing of thought processes Cardiac problems are also caused by insufficient amounts of thyroid hormone. Hypothyroidism in the newborn is cretinism, and is an extreme form. It is important that this form be treated adequately and immediately or it will lead rapidly to retarded physical and mental development. DIAGNOSIS  To prove hypothyroidism, your caregiver may do blood tests and ultrasound tests. Sometimes the signs are hidden. It may be necessary for your caregiver to watch this illness with blood tests either before or after diagnosis and treatment. TREATMENT  Low levels of thyroid hormone are increased by using synthetic thyroid hormone. This is a safe, effective treatment. It usually takes about four weeks to gain the full effects of the medication. After you have the full effect of the medication, it will generally take another four weeks for problems to leave. Your caregiver may start you on low doses. If you have had heart problems the dose may be gradually increased. It is generally not an emergency to get rapidly to normal. HOME CARE INSTRUCTIONS   Take your  medications as your caregiver suggests. Let your caregiver know of any medications you are taking or start taking. Your caregiver will help you with dosage schedules.  As your condition improves, your dosage needs may increase. It will be necessary to have continuing blood tests as suggested by your caregiver.  Report all suspected medication side effects to your caregiver. SEEK MEDICAL CARE IF: Seek medical care if you develop:  Sweating.  Tremulousness (tremors).  Anxiety.  Rapid weight loss.  Heat intolerance.  Emotional swings.  Diarrhea.  Weakness. SEEK IMMEDIATE MEDICAL CARE IF:  You develop chest pain, an irregular heart beat (palpitations), or a rapid heart beat. MAKE SURE YOU:   Understand these instructions.  Will watch your condition.  Will get help right away if you are not doing well or get worse. Document Released: 01/11/2005 Document Revised: 04/05/2011 Document Reviewed: 09/01/2007 ExitCare Patient Information 2015 ExitCare, LLC. This information is not intended to replace advice given to you by your health care provider. Make sure you discuss any questions you have with your health care provider.  

## 2014-10-11 LAB — TSH: TSH: 0.436 u[IU]/mL (ref 0.350–4.500)

## 2014-10-11 MED ORDER — LEVOTHYROXINE SODIUM 125 MCG PO TABS: 125.0000 ug | ORAL_TABLET | Freq: Every day | ORAL | Status: DC

## 2014-10-11 NOTE — Progress Notes (Signed)
Subjective:    Patient ID: Brad Buck, male    DOB: 08/16/1990, 24 y.o.   MRN: [id number] Chief Complaint  Patient presents with  . Follow-up    thyroid with labwork    HPI  Seen 3 mos ago with severe fatigue, back pain, depression, arthralgias and found to be hypothyroid. Was started on levothyroxine 125 and pt reports he is doing well.  All sxs have resolved, he is great, no complaints, back to his baseline. Both his mother and father have thyroid problems so he was not surprised nor interested in this diagnosis and has no questions or concerns about etiology, prognosis, and natural course. Is taking his levothyroxine daily with water only 30  Min before food or other meds and no prob w/ compliance. Understands he is now going to take daily medication indefinitely and have labs rechecked q4-6 mos until stable for yrs then annually.  Past Medical History  Diagnosis Date  . ADD (attention deficit disorder)    Current Outpatient Prescriptions on File Prior to Visit  Medication Sig Dispense Refill  . ALPRAZolam (XANAX) 0.25 MG tablet Take 1 tablet (0.25 mg total) by mouth 2 (two) times daily as needed for anxiety. 60 tablet 0  . dextroamphetamine (DEXEDRINE) 10 MG 24 hr capsule Take 2 capsules (20 mg total) by mouth daily. 60 capsule 0  . dextroamphetamine (DEXEDRINE) 10 MG 24 hr capsule Take 2 capsules (20 mg total) by mouth daily. 180 capsule 0   No current facility-administered medications on file prior to visit.   No Known Allergies   Depression screen [facility] 2/9 10/10/2014 09/18/2014 08/01/2014  Decreased Interest 0 0 0  Down, Depressed, Hopeless 0 0 0  PHQ - 2 Score 0 0 0      Review of Systems  Constitutional: Negative for fever, chills, diaphoresis, activity change, appetite change, fatigue and unexpected weight change.  Eyes: Negative for visual disturbance.  Cardiovascular: Negative for chest pain and leg swelling.  Gastrointestinal: Negative.  Negative for nausea,  vomiting, diarrhea, constipation and abdominal distention.  Endocrine: Negative for cold intolerance, heat intolerance, polydipsia, polyphagia and polyuria.  Musculoskeletal: Negative for myalgias, back pain, joint swelling, arthralgias, gait problem, neck pain and neck stiffness.  Skin: Negative for color change.  Neurological: Negative for dizziness, tremors, seizures, syncope, weakness, light-headedness, numbness and headaches.  Hematological: Does not bruise/bleed easily.  Psychiatric/Behavioral: Negative for behavioral problems, sleep disturbance, dysphoric mood and agitation. The patient is not nervous/anxious.        Objective:  BP 105/72 mmHg  Pulse 82  Temp(Src) 99.1 F (37.3 C) (Oral)  Resp 16  Ht  (1.778 m)  Wt 227 lb 6.4 oz (103.148 kg)  BMI 32.63 kg/m2  Physical Exam  Constitutional: He is oriented to person, place, and time. He appears well-developed and well-nourished. No distress.  HENT:  Head: Normocephalic and atraumatic.  Eyes: Conjunctivae are normal. Pupils are equal, round, and reactive to light. No scleral icterus.  Neck: Normal range of motion. Neck supple. No thyromegaly present.  Cardiovascular: Normal rate, regular rhythm, normal heart sounds and intact distal pulses.   Pulmonary/Chest: Effort normal and breath sounds normal. No respiratory distress.  Genitourinary: Penis normal.  Musculoskeletal: He exhibits no edema.  Lymphadenopathy:    He has no cervical adenopathy.  Neurological: He is alert and oriented to person, place, and time.  Skin: Skin is warm and dry. He is not diaphoretic.  Psychiatric: He has a normal mood and affect. His behavior  is normal.          Assessment & Plan:   1. Hypothyroidism, unspecified hypothyroidism type   Reviewed likely Hashimoto's but as long as exam, sxs, and response to thyroid replacement remains stable no need to do any further eval. Reminded pt to take levothyroxine with water only every morning 30  min prior other food, drink, meds, supplements and if dose if forgotten 2 hrs after.  If med adjustment is made, will want to recheck labe in 2 mos. Otherwise will want to monitor tsh ever 4-6 mos for the first 1-2 yrs and then annually as long as dose has been stable. Pt is seen every 6 mos for refills on his ADD meds so labs can be done at those visit.  Pt w/o any questions or concerns. F/u in Feb for repeat labs and med refill.  Orders Placed This Encounter  Procedures  . TSH    Meds ordered this encounter  Medications  . levothyroxine (SYNTHROID, LEVOTHROID) 125 MCG tablet    Sig: Take 1 tablet (125 mcg total) by mouth daily before breakfast.    Dispense:  90 tablet    Refill:  1    I personally performed the services described in this documentation, wh Norberto Sorenson, MD MPH  Results for orders placed or performed in visit on 10/10/14  TSH  Result Value Ref Range   TSH 0.436 0.350 - 4.500 uIU/mL

## 2015-01-18 ENCOUNTER — Ambulatory Visit (INDEPENDENT_AMBULATORY_CARE_PROVIDER_SITE_OTHER): Payer: 59 | Admitting: Internal Medicine

## 2015-01-18 VITALS — BP 114/80 | HR 88 | Temp 98.6°F | Resp 18 | Ht 71.0 in | Wt 242.0 lb

## 2015-01-18 DIAGNOSIS — E039 Hypothyroidism, unspecified: Secondary | ICD-10-CM | POA: Diagnosis not present

## 2015-01-18 DIAGNOSIS — F909 Attention-deficit hyperactivity disorder, unspecified type: Secondary | ICD-10-CM

## 2015-01-18 DIAGNOSIS — F988 Other specified behavioral and emotional disorders with onset usually occurring in childhood and adolescence: Secondary | ICD-10-CM

## 2015-01-18 DIAGNOSIS — R7989 Other specified abnormal findings of blood chemistry: Secondary | ICD-10-CM | POA: Diagnosis not present

## 2015-01-18 DIAGNOSIS — R945 Abnormal results of liver function studies: Secondary | ICD-10-CM

## 2015-01-18 LAB — COMPREHENSIVE METABOLIC PANEL
ALT: 28 U/L (ref 9–46)
AST: 17 U/L (ref 10–40)
Albumin: 4.3 g/dL (ref 3.6–5.1)
Alkaline Phosphatase: 56 U/L (ref 40–115)
BUN: 14 mg/dL (ref 7–25)
CO2: 28 mmol/L (ref 20–31)
Calcium: 9.5 mg/dL (ref 8.6–10.3)
Chloride: 104 mmol/L (ref 98–110)
Creat: 0.89 mg/dL (ref 0.60–1.35)
Glucose, Bld: 96 mg/dL (ref 65–99)
Potassium: 4.4 mmol/L (ref 3.5–5.3)
Sodium: 140 mmol/L (ref 135–146)
Total Bilirubin: 0.3 mg/dL (ref 0.2–1.2)
Total Protein: 7.1 g/dL (ref 6.1–8.1)

## 2015-01-18 LAB — POCT CBC
Granulocyte percent: 47.9 %G (ref 37–80)
HCT, POC: 41 % — AB (ref 43.5–53.7)
Hemoglobin: 14.2 g/dL (ref 14.1–18.1)
Lymph, poc: 2.6 (ref 0.6–3.4)
MCH, POC: 28.3 pg (ref 27–31.2)
MCHC: 34.5 g/dL (ref 31.8–35.4)
MCV: 82 fL (ref 80–97)
MID (cbc): 0.9 (ref 0–0.9)
MPV: 6.6 fL (ref 0–99.8)
POC Granulocyte: 3.2 (ref 2–6.9)
POC LYMPH PERCENT: 39.3 %L (ref 10–50)
POC MID %: 12.8 %M — AB (ref 0–12)
Platelet Count, POC: 268 10*3/uL (ref 142–424)
RBC: 5 M/uL (ref 4.69–6.13)
RDW, POC: 13.3 %
WBC: 6.7 10*3/uL (ref 4.6–10.2)

## 2015-01-18 LAB — T4, FREE: Free T4: 1.13 ng/dL (ref 0.80–1.80)

## 2015-01-18 LAB — TSH: TSH: 0.111 u[IU]/mL — ABNORMAL LOW (ref 0.350–4.500)

## 2015-01-18 MED ORDER — AMPHETAMINE-DEXTROAMPHETAMINE 10 MG PO TABS: 10.0000 mg | ORAL_TABLET | Freq: Two times a day (BID) | ORAL | Status: DC

## 2015-01-18 NOTE — Progress Notes (Signed)
Subjective:  By signing my name below, I, Brad Buck, attest that this documentation has been prepared under the direction and in the presence of Ellamae Siaobert Special Ranes, MD.  Brad Buck, Medical Scribe. 01/18/2015.  11:07 AM.  I have completed the patient encounter in its entirety as documented by the scribe, with editing by me where necessary. Brad Buck, M.D. }   Patient ID: Brad Buck, male    DOB: 28-Nov-1990, 24 y.o.   MRN: [id number]  Chief Complaint  Patient presents with  . Medication Refill    discuss change  . GI Problem    for mths now     HPI HPI Comments: Brad Buck is a 24 y.o. male who presents to [facility] complaining of GI problems,  Pt states that he experiences loose stools and diarrhea for up to 4-5 times per day, mild abdominal pain, and gas build up. He indicates that these symptoms have started at about the same time as his thyroid issues. He notes that he has not been having a healthy diet during this year, and reports that cutting in taking coffee and dairy products have improved his symptoms. He denies hematochezia, fever.  Pt believes that the last time he had thyroid blood testing done was possibly during the fall.   Pt would like to discuss his dexedrine medication due to the impractically of being able to have it dispensed.   Pt reports that he has graduated form graduate college. 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    Patient Active Problem List   Diagnosis Date Noted  . Thyroid activity decreased 08/07/2014  . ADD (attention deficit disorder) 08/28/2011   Past Medical History  Diagnosis Date  . ADD (attention deficit disorder)    Past Surgical History  Procedure Laterality Date  . Tympanostomy tube placement    . Wisdom tooth extraction    . Toe nail removal     No Known Allergies Prior to Admission medications   Medication Sig Start Date End Date Taking?  Authorizing Provider  ALPRAZolam (XANAX) 0.25 MG tablet Take 1 tablet (0.25 mg total) by mouth 2 (two) times daily as needed for anxiety. 04/24/14  Yes Tonye Pearsonobert P Enzo Treu, MD  dextroamphetamine (DEXEDRINE) 10 MG 24 hr capsule Take 2 capsules (20 mg total) by mouth daily. 09/18/14  Yes Tonye Pearsonobert P Makaveli Hoard, MD  dextroamphetamine (DEXEDRINE) 10 MG 24 hr capsule Take 2 capsules (20 mg total) by mouth daily. 09/18/14   Tonye Pearsonobert P Levonne Carreras, MD  levothyroxine (SYNTHROID, LEVOTHROID) 125 MCG tablet Take 1 tablet (125 mcg total) by mouth daily before breakfast. Patient not taking: Reported on 01/18/2015 10/11/14   Sherren MochaEva N Shaw, MD   Social History   Social History  . Marital Status: Married    Spouse Name: N/A  . Number of Children: N/A  . Years of Education: N/A   Occupational History  . Not on file.   Social History Main Topics  . Smoking status: Never Smoker   . Smokeless tobacco: Not on file  . Alcohol Use: 1.5 oz/week    3 drink(s) per week  . Drug Use: No  . Sexual Activity: Yes   Other Topics Concern  . Not on file   Social History Narrative    Review of Systems  Constitutional: Negative for fever.  Gastrointestinal: Positive for abdominal pain. Negative for blood in stool.      Objective:  Physical Exam  Constitutional: He is oriented to person, place, and time. He appears well-developed and well-nourished. No distress.  HENT:  Head: Normocephalic and atraumatic.  Eyes: EOM are normal. Pupils are equal, round, and reactive to light.  Neck: Neck supple.  Cardiovascular: Normal rate.   Pulmonary/Chest: Effort normal.  Neurological: He is alert and oriented to person, place, and time. No cranial nerve deficit.  Skin: Skin is warm and dry.  Psychiatric: He has a normal mood and affect. His behavior is normal.  Nursing note and vitals reviewed.   BP 114/80 mmHg  Pulse 88  Temp(Src) 98.6 F (37 C) (Oral)  Resp 18  Ht  (1.803 m)  Wt 242 lb (109.77 kg)  BMI 33.77  kg/m2  SpO2 98%     Assessment & Plan:  Hypothyroidism, unspecified hypothyroidism type - Plan: TSH, T4, free, POCT CBC  Abnormal LFTs - Plan: Comprehensive metabolic panel, POCT CBC  ADD (attention deficit disorder)  Meds ordered this encounter  Medications  . amphetamine-dextroamphetamine (ADDERALL) 10 MG tablet    Sig: Take 1 tablet (10 mg total) by mouth 2 (two) times daily.    Dispense:  60 tablet    Refill:  0  trial--titrate and notify me    Addend -labs Results for orders placed or performed in visit on 01/18/15  Comprehensive metabolic panel  Result Value Ref Range   Sodium 140 135 - 146 mmol/L   Potassium 4.4 3.5 - 5.3 mmol/L   Chloride 104 98 - 110 mmol/L   CO2 28 20 - 31 mmol/L   Glucose, Bld 96 65 - 99 mg/dL   BUN 14 7 - 25 mg/dL   Creat 6.96 2.95 - 2.84 mg/dL   Total Bilirubin 0.3 0.2 - 1.2 mg/dL   Alkaline Phosphatase 56 40 - 115 U/L   AST 17 10 - 40 U/L   ALT 28 9 - 46 U/L   Total Protein 7.1 6.1 - 8.1 g/dL   Albumin 4.3 3.6 - 5.1 g/dL   Calcium 9.5 8.6 - 13.2 mg/dL  TSH  Result Value Ref Range   TSH 0.111 (L) 0.350 - 4.500 uIU/mL  T4, free  Result Value Ref Range   Free T4 1.13 0.80 - 1.80 ng/dL  POCT CBC  Result Value Ref Range   WBC 6.7 4.6 - 10.2 K/uL   Lymph, poc 2.6 0.6 - 3.4   POC LYMPH PERCENT 39.3 10 - 50 %L   MID (cbc) 0.9 0 - 0.9   POC MID % 12.8 (A) 0 - 12 %M   POC Granulocyte 3.2 2 - 6.9   Granulocyte percent 47.9 37 - 80 %G   RBC 5.00 4.69 - 6.13 M/uL   Hemoglobin 14.2 14.1 - 18.1 g/dL   HCT, POC 44.0 (A) 10.2 - 53.7 %   MCV 82.0 80 - 97 fL   MCH, POC 28.3 27 - 31.2 pg   MCHC 34.5 31.8 - 35.4 g/dL   RDW, POC 72.5 %   Platelet Count, POC 268 142 - 424 K/uL   MPV 6.6 0 - 99.8 fL

## 2015-01-19 ENCOUNTER — Encounter: Payer: Self-pay | Admitting: Internal Medicine

## 2015-02-05 ENCOUNTER — Other Ambulatory Visit: Payer: Self-pay | Admitting: Family Medicine

## 2015-02-08 ENCOUNTER — Telehealth: Payer: Self-pay

## 2015-02-08 NOTE — Telephone Encounter (Signed)
Mom is requesting rx refill on Adderal.... Trial went well...   [facility]  [phone]

## 2015-02-11 NOTE — Telephone Encounter (Signed)
Will [facility] fill a 90-day supply?  If so, let's ask Dr. Cleta Alberts or Dr. Katrinka Blazing to authorize this on Dr. Netta Corrigan behalf.  If not, I will write the Rx.

## 2015-02-12 NOTE — Telephone Encounter (Signed)
Called pt's mother and she reported:  The 30 day trial went well, so she would like another 30 day RF that she can get filled locally, and then a 90 day one to send to [facility]. Mother advised that Brad Buck has been filling the 90 day Rxs for Dexedrine pt was previously on, so she believes they will also fill the 90 day Rx for Adderall. Mother agrees that if she can get a 30 day RF to get filled locally written by someone filling in for Dr Merla Riches now, she can wait until Dr Doolittle's return for the 90 day script. I will pend the 30 day script and send to PA pool. There is the same pending req for pt's brother Brad Buck), so we need to wait to call mother until both are ready.

## 2015-02-13 MED ORDER — AMPHETAMINE-DEXTROAMPHETAMINE 10 MG PO TABS: 10.0000 mg | ORAL_TABLET | Freq: Two times a day (BID) | ORAL | Status: DC

## 2015-02-13 NOTE — Telephone Encounter (Signed)
Ready for pickup - please send to Dr Merla Riches with note about a 90d supply for the future

## 2015-02-18 ENCOUNTER — Other Ambulatory Visit: Payer: Self-pay | Admitting: Internal Medicine

## 2015-02-19 NOTE — Telephone Encounter (Signed)
Done

## 2015-02-19 NOTE — Telephone Encounter (Signed)
rx faxed to pharmacy

## 2015-02-22 MED ORDER — AMPHETAMINE-DEXTROAMPHETAMINE 10 MG PO TABS: 10.0000 mg | ORAL_TABLET | Freq: Two times a day (BID) | ORAL | Status: DC

## 2015-02-22 NOTE — Telephone Encounter (Signed)
I Meds ordered this encounter  Medications  . DISCONTD: amphetamine-dextroamphetamine (ADDERALL) 10 MG tablet    Sig: Take 1 tablet (10 mg total) by mouth 2 (two) times daily.    Dispense:  60 tablet    Refill:  0  . amphetamine-dextroamphetamine (ADDERALL) 10 MG tablet    Sig: Take 1 tablet (10 mg total) by mouth 2 (two) times daily. 90d supply    Dispense:  180 tablet    Refill:  0

## 2015-02-23 NOTE — Telephone Encounter (Signed)
Lm prescription ready for pick up 

## 2015-05-12 ENCOUNTER — Other Ambulatory Visit: Payer: Self-pay | Admitting: Family Medicine

## 2015-05-14 ENCOUNTER — Telehealth: Payer: Self-pay

## 2015-05-14 DIAGNOSIS — R1013 Epigastric pain: Principal | ICD-10-CM

## 2015-05-14 DIAGNOSIS — R194 Change in bowel habit: Secondary | ICD-10-CM

## 2015-05-14 DIAGNOSIS — K319 Disease of stomach and duodenum, unspecified: Secondary | ICD-10-CM

## 2015-05-14 NOTE — Telephone Encounter (Signed)
Pt states he had seen Dr Merla Richesoolittle for stomach issues and would like to have a referral since he's still having problems. Please call [phone]

## 2015-05-15 ENCOUNTER — Encounter: Payer: Self-pay | Admitting: Gastroenterology

## 2015-05-15 NOTE — Telephone Encounter (Signed)
Dyspepsia and disorder of function of stomach - Plan: Ambulatory referral to Gastroenterology  Change in bowel habits - Plan: Ambulatory referral to Gastroenterology

## 2015-05-15 NOTE — Telephone Encounter (Signed)
Do you want him to come back first?

## 2015-06-10 ENCOUNTER — Ambulatory Visit (INDEPENDENT_AMBULATORY_CARE_PROVIDER_SITE_OTHER): Payer: 59 | Admitting: Licensed Clinical Social Worker

## 2015-06-10 DIAGNOSIS — F320 Major depressive disorder, single episode, mild: Secondary | ICD-10-CM

## 2015-06-27 ENCOUNTER — Ambulatory Visit (INDEPENDENT_AMBULATORY_CARE_PROVIDER_SITE_OTHER): Payer: 59 | Admitting: Licensed Clinical Social Worker

## 2015-06-27 DIAGNOSIS — F3173 Bipolar disorder, in partial remission, most recent episode manic: Secondary | ICD-10-CM

## 2015-07-15 ENCOUNTER — Other Ambulatory Visit (INDEPENDENT_AMBULATORY_CARE_PROVIDER_SITE_OTHER): Payer: 59

## 2015-07-15 ENCOUNTER — Encounter: Payer: Self-pay | Admitting: Gastroenterology

## 2015-07-15 ENCOUNTER — Ambulatory Visit (INDEPENDENT_AMBULATORY_CARE_PROVIDER_SITE_OTHER): Payer: 59 | Admitting: Gastroenterology

## 2015-07-15 VITALS — BP 130/70 | HR 82 | Ht 70.0 in | Wt 261.0 lb

## 2015-07-15 DIAGNOSIS — R197 Diarrhea, unspecified: Secondary | ICD-10-CM

## 2015-07-15 LAB — CBC WITH DIFFERENTIAL/PLATELET
BASOS PCT: 0.9 % (ref 0.0–3.0)
Basophils Absolute: 0.1 10*3/uL (ref 0.0–0.1)
EOS PCT: 4.1 % (ref 0.0–5.0)
Eosinophils Absolute: 0.3 10*3/uL (ref 0.0–0.7)
HCT: 39.6 % (ref 39.0–52.0)
HEMOGLOBIN: 13.5 g/dL (ref 13.0–17.0)
Lymphocytes Relative: 35.1 % (ref 12.0–46.0)
Lymphs Abs: 2.7 10*3/uL (ref 0.7–4.0)
MCHC: 34.1 g/dL (ref 30.0–36.0)
MCV: 80.8 fl (ref 78.0–100.0)
MONO ABS: 0.9 10*3/uL (ref 0.1–1.0)
Monocytes Relative: 11.8 % (ref 3.0–12.0)
NEUTROS ABS: 3.8 10*3/uL (ref 1.4–7.7)
Neutrophils Relative %: 48.1 % (ref 43.0–77.0)
Platelets: 317 10*3/uL (ref 150.0–400.0)
RBC: 4.9 Mil/uL (ref 4.22–5.81)
RDW: 13.5 % (ref 11.5–15.5)
WBC: 7.8 10*3/uL (ref 4.0–10.5)

## 2015-07-15 LAB — COMPREHENSIVE METABOLIC PANEL
ALBUMIN: 4.4 g/dL (ref 3.5–5.2)
ALT: 36 U/L (ref 0–53)
AST: 22 U/L (ref 0–37)
Alkaline Phosphatase: 79 U/L (ref 39–117)
BUN: 14 mg/dL (ref 6–23)
CALCIUM: 9.4 mg/dL (ref 8.4–10.5)
CHLORIDE: 103 meq/L (ref 96–112)
CO2: 29 mEq/L (ref 19–32)
Creatinine, Ser: 1.05 mg/dL (ref 0.40–1.50)
GFR: 91.39 mL/min (ref >60.00)
Glucose, Bld: 77 mg/dL (ref 70–99)
POTASSIUM: 3.8 meq/L (ref 3.5–5.1)
SODIUM: 138 meq/L (ref 135–145)
Total Bilirubin: 0.4 mg/dL (ref 0.2–1.2)
Total Protein: 7.4 g/dL (ref 6.0–8.3)

## 2015-07-15 LAB — IGA: IGA: 210 mg/dL (ref 68–378)

## 2015-07-15 NOTE — Progress Notes (Signed)
HPI: This is a   very pleasant 25 year old man    who was referred to me by Tonye Pearsonoolittle, Robert P, MD  to evaluate  loose stools .    Chief complaint is intermittent loose stools   Every 2 weeks he'll have diarrhea. Two days of very loose stools. Will have 6 watery stools those days. Never nocturnal symptoms.  In between those events he'll have looser than usual stools also.  Really back and forth with stools.   Has been going on about a year.  Rumbly, bubbly feeling in abd.  No antibiotics  Gaining weight.  Pretty rare caffeine, usually etoh only on weekends.  Yogurt, worse.  Milk +/- here.  Fatty foods make it worse  Has not tried lactaid pills.  Has not tried imodium.  No IBD, no colon cancer in family.  He does not eat a lot of sugar free candies, diet drinks, sports drinks, gum   Review of systems: Pertinent positive and negative review of systems were noted in the above HPI section. Complete review of systems was performed and was otherwise normal.   Past Medical History  Diagnosis Date  . ADD (attention deficit disorder)   . Depression   . Hypothyroidism   . Anxiety     Past Surgical History  Procedure Laterality Date  . Tympanostomy tube placement      as an infant  . Wisdom tooth extraction    . Toe nail removal Left 2015    Current Outpatient Prescriptions  Medication Sig Dispense Refill  . ALPRAZolam (XANAX) 0.25 MG tablet TAKE 1 TABLET BY MOUTH TWICE A DAY AS NEEDED FOR ANXIETY 60 tablet 0  . amphetamine-dextroamphetamine (ADDERALL) 10 MG tablet Take 1 tablet (10 mg total) by mouth 2 (two) times daily. 90d supply 180 tablet 0  . levothyroxine (SYNTHROID, LEVOTHROID) 125 MCG tablet TAKE ONE TABLET EACH DAY BEFORE BREAKFAST 90 tablet 0   No current facility-administered medications for this visit.    Allergies as of 07/15/2015  . (No Known Allergies)    Family History  Problem Relation Age of Onset  . Heart disease Father   . Diabetes  Father   . Heart disease Maternal Grandmother   . Heart disease Paternal Grandmother   . Heart disease Paternal Grandfather   . Colon cancer Neg Hx     Social History   Social History  . Marital Status: Single    Spouse Name: N/A  . Number of Children: 0  . Years of Education: N/A   Occupational History  . Banker    Social History Main Topics  . Smoking status: Never Smoker   . Smokeless tobacco: Never Used  . Alcohol Use: 1.8 oz/week    3 Standard drinks or equivalent per week     Comment: social  . Drug Use: No  . Sexual Activity: Yes   Other Topics Concern  . Not on file   Social History Narrative     Physical Exam: BP 130/70 mmHg  Pulse 82  Ht 5\' 10"  (1.778 m)  Wt 261 lb (118.389 kg)  BMI 37.45 kg/m2 Constitutional: generally well-appearing Psychiatric: alert and oriented x3 Eyes: extraocular movements intact Mouth: oral pharynx moist, no lesions Neck: supple no lymphadenopathy Cardiovascular: heart regular rate and rhythm Lungs: clear to auscultation bilaterally Abdomen: soft, nontender, nondistended, no obvious ascites, no peritoneal signs, normal bowel sounds Extremities: no lower extremity edema bilaterally Skin: no lesions on visible extremities   Assessment and plan: 25 y.o. male  with  Intermittent loose stools  He is of [place] heritage and with these chronic loose stools certainly possible that he has underlying celiac sprue. I recommended we proceed with blood work and stool studies, see those below in the instructions. He is also try dairy free trial for 5-7 days to see if lactose intolerance is playing a role here. I recommended that if none of the blow testing is helpful then we may need to proceed with colonoscopy check for inflammatory bowel disease.   Rob Bunting, MD [facility] 07/15/2015, 2:42 PM  Cc: Tonye Pearson, MD

## 2015-07-15 NOTE — Patient Instructions (Signed)
You will have labs checked today in the basement lab.  Please head down after you check out with the front desk  (cbc, cmet, tTG, total IgA, stool for ova/parasites, stool for routine culture). Dairy free trial for 5-7 days. Depending on the results of the above, you may need colonoscopy.

## 2015-07-16 ENCOUNTER — Ambulatory Visit (INDEPENDENT_AMBULATORY_CARE_PROVIDER_SITE_OTHER): Payer: 59 | Admitting: Licensed Clinical Social Worker

## 2015-07-16 ENCOUNTER — Other Ambulatory Visit: Payer: 59

## 2015-07-16 DIAGNOSIS — F324 Major depressive disorder, single episode, in partial remission: Secondary | ICD-10-CM | POA: Diagnosis not present

## 2015-07-16 DIAGNOSIS — R197 Diarrhea, unspecified: Secondary | ICD-10-CM

## 2015-07-16 LAB — TISSUE TRANSGLUTAMINASE, IGA: Tissue Transglutaminase Ab, IgA: 1 U/mL (ref <4)

## 2015-08-20 ENCOUNTER — Other Ambulatory Visit: Payer: Self-pay

## 2015-08-20 MED ORDER — LEVOTHYROXINE SODIUM 125 MCG PO TABS: ORAL_TABLET | ORAL | 0 refills | Status: DC

## 2015-08-23 ENCOUNTER — Ambulatory Visit (INDEPENDENT_AMBULATORY_CARE_PROVIDER_SITE_OTHER): Payer: 59 | Admitting: Physician Assistant

## 2015-08-23 VITALS — BP 100/70 | HR 79 | Temp 98.4°F | Resp 18 | Ht 69.0 in | Wt 260.4 lb

## 2015-08-23 DIAGNOSIS — Z114 Encounter for screening for human immunodeficiency virus [HIV]: Secondary | ICD-10-CM | POA: Diagnosis not present

## 2015-08-23 DIAGNOSIS — F988 Other specified behavioral and emotional disorders with onset usually occurring in childhood and adolescence: Secondary | ICD-10-CM

## 2015-08-23 DIAGNOSIS — F909 Attention-deficit hyperactivity disorder, unspecified type: Secondary | ICD-10-CM | POA: Diagnosis not present

## 2015-08-23 DIAGNOSIS — Z23 Encounter for immunization: Secondary | ICD-10-CM

## 2015-08-23 DIAGNOSIS — E039 Hypothyroidism, unspecified: Secondary | ICD-10-CM

## 2015-08-23 LAB — THYROID PANEL WITH TSH
Free Thyroxine Index: 2.3 (ref 1.4–3.8)
T3 Uptake: 32 % (ref 22–35)
T4 TOTAL: 7.3 ug/dL (ref 4.5–12.0)
TSH: 0.05 mIU/L — ABNORMAL LOW (ref 0.40–4.50)

## 2015-08-23 MED ORDER — AMPHETAMINE-DEXTROAMPHETAMINE 10 MG PO TABS: 10.0000 mg | ORAL_TABLET | Freq: Two times a day (BID) | ORAL | 0 refills | Status: AC

## 2015-08-23 MED ORDER — LEVOTHYROXINE SODIUM 125 MCG PO TABS: ORAL_TABLET | ORAL | 3 refills | Status: DC

## 2015-08-23 NOTE — Patient Instructions (Signed)
     IF you received an x-ray today, you will receive an invoice from [facility]. Please contact [facility] at [phone] with questions or concerns regarding your invoice.   IF you received labwork today, you will receive an invoice from [facility]. Please contact [facility] at [phone] with questions or concerns regarding your invoice.   Our billing staff will not be able to assist you with questions regarding bills from these companies.  You will be contacted with the lab results as soon as they are available. The fastest way to get your results is to activate your My Chart account. Instructions are located on the last page of this paperwork. If you have not heard from us regarding the results in 2 weeks, please contact this office.      

## 2015-08-23 NOTE — Progress Notes (Signed)
Patient ID: Brad Buck, male    DOB: August 23, 1990, 25 y.o.   MRN: [id number]  PCP: Tonye Pearson, MD  Subjective:   Chief Complaint  Patient presents with  . Medication Refill    adderall 10mg  and synthroid    HPI Presents for medication refills.   Feels like Adderall dose is working well. Feels good on the Synthroid. No adverse effects. Sometimes forgets the Adderall dose because he has to wait followingthe levothyroxine dose.  He is in the process of moving to [place], [place], and needs help transitioning his care to a provider there, as Dr. Merla Riches has retired.    Review of Systems No chest pain, SOB, HA, dizziness, vision change, N/V, diarrhea, constipation, dysuria, urinary urgency or frequency, myalgias, arthralgias or rash.     Patient Active Problem List   Diagnosis Date Noted  . Thyroid activity decreased 08/07/2014  . ADD (attention deficit disorder) 08/28/2011     Prior to Admission medications   Medication Sig Start Date End Date Taking? Authorizing Provider  ALPRAZolam Prudy Feeler) 0.25 MG tablet TAKE 1 TABLET BY MOUTH TWICE A DAY AS NEEDED FOR ANXIETY 02/19/15  Yes Morrell Riddle, PA-C  amphetamine-dextroamphetamine (ADDERALL) 10 MG tablet Take 1 tablet (10 mg total) by mouth 2 (two) times daily. 90d supply 02/22/15  Yes Tonye Pearson, MD  levothyroxine (SYNTHROID, LEVOTHROID) 125 MCG tablet TAKE ONE TABLET EACH DAY BEFORE BREAKFAST 08/20/15  Yes Elvis Boot, PA-C     No Known Allergies     Objective:  Physical Exam  Constitutional: He is oriented to person, place, and time. He appears well-developed and well-nourished. He is active and cooperative. No distress.  BP 100/70   Pulse 79   Temp 98.4 F (36.9 C) (Oral)   Resp 18   Ht 5\' 9"  (1.753 m)   Wt 260 lb 6.4 oz (118.1 kg)   SpO2 96%   BMI 38.45 kg/m   HENT:  Head: Normocephalic and atraumatic.  Right Ear: Hearing normal.  Left Ear: Hearing normal.  Eyes: Conjunctivae are  normal. No scleral icterus.  Neck: Normal range of motion. Neck supple. No thyromegaly present.  Cardiovascular: Normal rate, regular rhythm and normal heart sounds.   Pulses:      Radial pulses are 2+ on the right side, and 2+ on the left side.  Pulmonary/Chest: Effort normal and breath sounds normal.  Lymphadenopathy:       Head (right side): No tonsillar, no preauricular, no posterior auricular and no occipital adenopathy present.       Head (left side): No tonsillar, no preauricular, no posterior auricular and no occipital adenopathy present.    He has no cervical adenopathy.       Right: No supraclavicular adenopathy present.       Left: No supraclavicular adenopathy present.  Neurological: He is alert and oriented to person, place, and time. No sensory deficit.  Skin: Skin is warm, dry and intact. No rash noted. No cyanosis or erythema. Nails show no clubbing.  Psychiatric: He has a normal mood and affect. His speech is normal and behavior is normal.           Assessment & Plan:  1. Hypothyroidism, unspecified hypothyroidism type Has been stable. Continue current treatment. Adjust dose if needed.  - Thyroid Panel With TSH - levothyroxine (SYNTHROID, LEVOTHROID) 125 MCG tablet; TAKE ONE TABLET EACH DAY BEFORE BREAKFAST  Dispense: 90 tablet; Refill: 3  2. ADD (attention deficit disorder) 3 months'  prescriptions provided. He will return here or establish with a new provider in [place], [place]. - amphetamine-dextroamphetamine (ADDERALL) 10 MG tablet; Take 1 tablet (10 mg total) by mouth 2 (two) times daily.  Dispense: 60 tablet; Refill: 0 - amphetamine-dextroamphetamine (ADDERALL) 10 MG tablet; Take 1 tablet (10 mg total) by mouth 2 (two) times daily.  Dispense: 60 tablet; Refill: 0 - amphetamine-dextroamphetamine (ADDERALL) 10 MG tablet; Take 1 tablet (10 mg total) by mouth 2 (two) times daily.  Dispense: 60 tablet; Refill: 0  3. Need for TD vaccine - Td vaccine greater than or  equal to 7yo preservative free IM  4. Screening for HIV (human immunodeficiency virus) - HIV antibody    Fernande Bras, PA-C Physician Assistant-Certified [facility]

## 2015-08-24 LAB — HIV ANTIBODY (ROUTINE TESTING W REFLEX): HIV: NONREACTIVE

## 2016-01-01 ENCOUNTER — Other Ambulatory Visit: Payer: Self-pay

## 2016-01-01 MED ORDER — LEVOTHYROXINE SODIUM 125 MCG PO TABS: ORAL_TABLET | ORAL | 2 refills | Status: DC

## 2016-01-01 NOTE — Telephone Encounter (Signed)
Sent in remaining RFs of levo to local pharm when req'd.

## 2016-04-20 ENCOUNTER — Encounter: Payer: Self-pay | Admitting: Physician Assistant

## 2016-10-30 ENCOUNTER — Other Ambulatory Visit: Payer: Self-pay | Admitting: Physician Assistant

## 2016-11-07 ENCOUNTER — Other Ambulatory Visit: Payer: Self-pay | Admitting: Physician Assistant

## 2016-12-11 ENCOUNTER — Other Ambulatory Visit: Payer: Self-pay | Admitting: Physician Assistant

## 2016-12-13 NOTE — Telephone Encounter (Signed)
Does pt need office visit prior to prescription refill

## 2016-12-15 NOTE — Telephone Encounter (Signed)
I don't see where pt has scheduled an appt; Can prescription be refilled since it is a thyroid med

## 2017-04-28 ENCOUNTER — Encounter: Payer: Self-pay | Admitting: Physician Assistant
